# Patient Record
Sex: Male | Born: 1945 | Race: White | Hispanic: No | Marital: Married | Smoking: Former smoker
Health system: Southern US, Community
[De-identification: ages and names within clinical notes are randomized; demographics above are authoritative.]

## PROBLEM LIST (undated history)

## (undated) DIAGNOSIS — E119 Type 2 diabetes mellitus without complications: Secondary | ICD-10-CM

## (undated) DIAGNOSIS — G5790 Unspecified mononeuropathy of unspecified lower limb: Secondary | ICD-10-CM

## (undated) DIAGNOSIS — J45909 Unspecified asthma, uncomplicated: Secondary | ICD-10-CM

## (undated) DIAGNOSIS — I10 Essential (primary) hypertension: Secondary | ICD-10-CM

## (undated) DIAGNOSIS — I739 Peripheral vascular disease, unspecified: Secondary | ICD-10-CM

## (undated) DIAGNOSIS — M1990 Unspecified osteoarthritis, unspecified site: Secondary | ICD-10-CM

## (undated) DIAGNOSIS — G4733 Obstructive sleep apnea (adult) (pediatric): Secondary | ICD-10-CM

## (undated) HISTORY — PX: HERNIA REPAIR: SHX51

## (undated) HISTORY — PX: CARPAL TUNNEL RELEASE: SHX101

## (undated) HISTORY — DX: Obstructive sleep apnea (adult) (pediatric): G47.33

## (undated) HISTORY — DX: Unspecified asthma, uncomplicated: J45.909

## (undated) HISTORY — PX: TONSILLECTOMY: SUR1361

## (undated) HISTORY — DX: Unspecified osteoarthritis, unspecified site: M19.90

## (undated) HISTORY — PX: KNEE ARTHROSCOPY: SUR90

## (undated) HISTORY — DX: Type 2 diabetes mellitus without complications: E11.9

## (undated) HISTORY — PX: CIRCUMCISION: SUR203

## (undated) HISTORY — PX: SPINE SURGERY: SHX786

## (undated) HISTORY — PX: JOINT REPLACEMENT: SHX530

## (undated) HISTORY — DX: Morbid (severe) obesity due to excess calories: E66.01

## (undated) HISTORY — DX: Peripheral vascular disease, unspecified: I73.9

## (undated) HISTORY — DX: Unspecified mononeuropathy of unspecified lower limb: G57.90

## (undated) HISTORY — DX: Essential (primary) hypertension: I10

## (undated) HISTORY — PX: GASTRIC BYPASS: SHX52

## (undated) HISTORY — PX: CHOLECYSTECTOMY: SHX55

---

## 1998-07-22 ENCOUNTER — Ambulatory Visit (HOSPITAL_COMMUNITY): Admission: RE | Admit: 1998-07-22 | Discharge: 1998-07-22 | Payer: Self-pay | Admitting: Orthopedic Surgery

## 1999-01-01 ENCOUNTER — Encounter: Payer: Self-pay | Admitting: Orthopedic Surgery

## 1999-01-01 ENCOUNTER — Ambulatory Visit (HOSPITAL_COMMUNITY): Admission: RE | Admit: 1999-01-01 | Discharge: 1999-01-01 | Payer: Self-pay | Admitting: Orthopedic Surgery

## 2004-12-02 ENCOUNTER — Encounter: Admission: RE | Admit: 2004-12-02 | Discharge: 2005-03-02 | Payer: Self-pay | Admitting: Surgery

## 2005-01-07 ENCOUNTER — Ambulatory Visit (HOSPITAL_COMMUNITY): Admission: RE | Admit: 2005-01-07 | Discharge: 2005-01-07 | Payer: Self-pay | Admitting: Surgery

## 2005-02-17 ENCOUNTER — Inpatient Hospital Stay (HOSPITAL_COMMUNITY): Admission: RE | Admit: 2005-02-17 | Discharge: 2005-02-20 | Payer: Self-pay | Admitting: Surgery

## 2005-04-17 ENCOUNTER — Encounter: Admission: RE | Admit: 2005-04-17 | Discharge: 2005-07-16 | Payer: Self-pay | Admitting: Surgery

## 2005-10-05 ENCOUNTER — Encounter: Admission: RE | Admit: 2005-10-05 | Discharge: 2005-12-13 | Payer: Self-pay | Admitting: Surgery

## 2006-01-08 ENCOUNTER — Encounter: Admission: RE | Admit: 2006-01-08 | Discharge: 2006-04-08 | Payer: Self-pay | Admitting: Surgery

## 2006-12-26 ENCOUNTER — Encounter: Admission: RE | Admit: 2006-12-26 | Discharge: 2006-12-26 | Payer: Self-pay | Admitting: Surgery

## 2009-01-18 ENCOUNTER — Inpatient Hospital Stay (HOSPITAL_COMMUNITY): Admission: RE | Admit: 2009-01-18 | Discharge: 2009-01-27 | Payer: Self-pay | Admitting: Neurosurgery

## 2009-06-25 ENCOUNTER — Encounter: Admission: RE | Admit: 2009-06-25 | Discharge: 2009-06-25 | Payer: Self-pay | Admitting: Neurosurgery

## 2010-02-18 ENCOUNTER — Encounter: Admission: RE | Admit: 2010-02-18 | Discharge: 2010-02-18 | Payer: Self-pay | Admitting: Neurosurgery

## 2010-03-21 ENCOUNTER — Inpatient Hospital Stay (HOSPITAL_COMMUNITY): Admission: RE | Admit: 2010-03-21 | Discharge: 2010-03-26 | Payer: Self-pay | Admitting: Orthopedic Surgery

## 2010-07-14 ENCOUNTER — Inpatient Hospital Stay (HOSPITAL_COMMUNITY): Admission: RE | Admit: 2010-07-14 | Discharge: 2010-07-17 | Payer: Self-pay | Admitting: Orthopedic Surgery

## 2010-07-16 ENCOUNTER — Ambulatory Visit: Payer: Self-pay | Admitting: Surgery

## 2010-07-16 ENCOUNTER — Encounter (INDEPENDENT_AMBULATORY_CARE_PROVIDER_SITE_OTHER): Payer: Self-pay | Admitting: Orthopedic Surgery

## 2011-01-03 ENCOUNTER — Encounter: Payer: Self-pay | Admitting: Surgery

## 2011-01-04 ENCOUNTER — Encounter: Payer: Self-pay | Admitting: Neurosurgery

## 2011-02-27 LAB — GLUCOSE, CAPILLARY
Glucose-Capillary: 109 mg/dL — ABNORMAL HIGH (ref 70–99)
Glucose-Capillary: 125 mg/dL — ABNORMAL HIGH (ref 70–99)
Glucose-Capillary: 133 mg/dL — ABNORMAL HIGH (ref 70–99)
Glucose-Capillary: 155 mg/dL — ABNORMAL HIGH (ref 70–99)
Glucose-Capillary: 155 mg/dL — ABNORMAL HIGH (ref 70–99)
Glucose-Capillary: 164 mg/dL — ABNORMAL HIGH (ref 70–99)

## 2011-02-27 LAB — BASIC METABOLIC PANEL
CO2: 29 mEq/L (ref 19–32)
Calcium: 8.4 mg/dL (ref 8.4–10.5)
GFR calc Af Amer: >60 mL/min (ref >60)
GFR calc non Af Amer: >60 mL/min (ref >60)
GFR calc non Af Amer: >60 mL/min (ref >60)
Glucose, Bld: 152 mg/dL — ABNORMAL HIGH (ref 70–99)
Glucose, Bld: 155 mg/dL — ABNORMAL HIGH (ref 70–99)
Potassium: 4 mEq/L (ref 3.5–5.1)
Potassium: 4.3 mEq/L (ref 3.5–5.1)
Sodium: 138 mEq/L (ref 135–145)
Sodium: 140 mEq/L (ref 135–145)

## 2011-02-27 LAB — CBC
HCT: 33.5 % — ABNORMAL LOW (ref 39.0–52.0)
Hemoglobin: 11.5 g/dL — ABNORMAL LOW (ref 13.0–17.0)
MCH: 30.5 pg (ref 26.0–34.0)
MCV: 87.9 fL (ref 78.0–100.0)
RBC: 3.77 MIL/uL — ABNORMAL LOW (ref 4.22–5.81)
RDW: 14.3 % (ref 11.5–15.5)
WBC: 6.7 10*3/uL (ref 4.0–10.5)

## 2011-02-27 LAB — PROTIME-INR
INR: 2.03 — ABNORMAL HIGH (ref 0.00–1.49)
Prothrombin Time: 23.1 seconds — ABNORMAL HIGH (ref 11.6–15.2)
Prothrombin Time: 26 seconds — ABNORMAL HIGH (ref 11.6–15.2)

## 2011-02-27 LAB — TYPE AND SCREEN: ABO/RH(D): O POS

## 2011-02-28 LAB — COMPREHENSIVE METABOLIC PANEL
Albumin: 4.6 g/dL (ref 3.5–5.2)
BUN: 13 mg/dL (ref 6–23)
Calcium: 9.9 mg/dL (ref 8.4–10.5)
Creatinine, Ser: 0.88 mg/dL (ref 0.4–1.5)
Total Protein: 7.8 g/dL (ref 6.0–8.3)

## 2011-02-28 LAB — CBC
MCH: 30.2 pg (ref 26.0–34.0)
MCV: 88.9 fL (ref 78.0–100.0)
Platelets: 285 10*3/uL (ref 150–400)
RDW: 14.6 % (ref 11.5–15.5)

## 2011-02-28 LAB — URINALYSIS, ROUTINE W REFLEX MICROSCOPIC
Hgb urine dipstick: NEGATIVE
Protein, ur: NEGATIVE mg/dL
Urobilinogen, UA: 1 mg/dL (ref 0.0–1.0)

## 2011-02-28 LAB — SURGICAL PCR SCREEN
MRSA, PCR: NEGATIVE
Staphylococcus aureus: NEGATIVE

## 2011-02-28 LAB — PROTIME-INR: INR: 1.09 (ref 0.00–1.49)

## 2011-02-28 LAB — APTT: aPTT: 31 seconds (ref 24–37)

## 2011-03-04 LAB — BASIC METABOLIC PANEL
BUN: 6 mg/dL (ref 6–23)
BUN: 7 mg/dL (ref 6–23)
CO2: 28 mEq/L (ref 19–32)
CO2: 30 mEq/L (ref 19–32)
Calcium: 8.7 mg/dL (ref 8.4–10.5)
Chloride: 103 mEq/L (ref 96–112)
Chloride: 103 mEq/L (ref 96–112)
Creatinine, Ser: 0.77 mg/dL (ref 0.4–1.5)
GFR calc Af Amer: >60 mL/min (ref >60)
GFR calc Af Amer: >60 mL/min (ref >60)
GFR calc non Af Amer: >60 mL/min (ref >60)
Glucose, Bld: 136 mg/dL — ABNORMAL HIGH (ref 70–99)
Glucose, Bld: 142 mg/dL — ABNORMAL HIGH (ref 70–99)
Potassium: 3.7 mEq/L (ref 3.5–5.1)
Potassium: 4.6 mEq/L (ref 3.5–5.1)
Sodium: 139 mEq/L (ref 135–145)

## 2011-03-04 LAB — COMPREHENSIVE METABOLIC PANEL
ALT: 52 U/L (ref 0–53)
AST: 48 U/L — ABNORMAL HIGH (ref 0–37)
Albumin: 4.6 g/dL (ref 3.5–5.2)
CO2: 31 mEq/L (ref 19–32)
Calcium: 9.8 mg/dL (ref 8.4–10.5)
GFR calc Af Amer: >60 mL/min (ref >60)
GFR calc non Af Amer: >60 mL/min (ref >60)
Sodium: 140 mEq/L (ref 135–145)

## 2011-03-04 LAB — PROTIME-INR
INR: 1.02 (ref 0.00–1.49)
INR: 2.23 — ABNORMAL HIGH (ref 0.00–1.49)
INR: 2.23 — ABNORMAL HIGH (ref 0.00–1.49)
INR: 2.3 — ABNORMAL HIGH (ref 0.00–1.49)
Prothrombin Time: 13.3 seconds (ref 11.6–15.2)
Prothrombin Time: 17.5 seconds — ABNORMAL HIGH (ref 11.6–15.2)
Prothrombin Time: 24.5 seconds — ABNORMAL HIGH (ref 11.6–15.2)
Prothrombin Time: 25.1 seconds — ABNORMAL HIGH (ref 11.6–15.2)

## 2011-03-04 LAB — CBC
HCT: 32.8 % — ABNORMAL LOW (ref 39.0–52.0)
HCT: 35.7 % — ABNORMAL LOW (ref 39.0–52.0)
Hemoglobin: 11.4 g/dL — ABNORMAL LOW (ref 13.0–17.0)
MCHC: 34.2 g/dL (ref 30.0–36.0)
MCHC: 34.2 g/dL (ref 30.0–36.0)
MCHC: 34.6 g/dL (ref 30.0–36.0)
MCHC: 34.8 g/dL (ref 30.0–36.0)
MCV: 91.4 fL (ref 78.0–100.0)
MCV: 92.3 fL (ref 78.0–100.0)
MCV: 92.9 fL (ref 78.0–100.0)
MCV: 93.1 fL (ref 78.0–100.0)
Platelets: 221 10*3/uL (ref 150–400)
Platelets: 232 10*3/uL (ref 150–400)
RBC: 3.59 MIL/uL — ABNORMAL LOW (ref 4.22–5.81)
RBC: 3.83 MIL/uL — ABNORMAL LOW (ref 4.22–5.81)
RBC: 4.76 MIL/uL (ref 4.22–5.81)
RDW: 13.5 % (ref 11.5–15.5)
WBC: 6.2 10*3/uL (ref 4.0–10.5)
WBC: 8 10*3/uL (ref 4.0–10.5)
WBC: 8.6 10*3/uL (ref 4.0–10.5)

## 2011-03-04 LAB — TYPE AND SCREEN
ABO/RH(D): O POS
Antibody Screen: NEGATIVE

## 2011-03-04 LAB — URINALYSIS, ROUTINE W REFLEX MICROSCOPIC
Bilirubin Urine: NEGATIVE
Nitrite: NEGATIVE
Specific Gravity, Urine: 1.023 (ref 1.005–1.030)
Urobilinogen, UA: 1 mg/dL (ref 0.0–1.0)

## 2011-03-04 LAB — GLUCOSE, CAPILLARY
Glucose-Capillary: 106 mg/dL — ABNORMAL HIGH (ref 70–99)
Glucose-Capillary: 122 mg/dL — ABNORMAL HIGH (ref 70–99)
Glucose-Capillary: 133 mg/dL — ABNORMAL HIGH (ref 70–99)
Glucose-Capillary: 140 mg/dL — ABNORMAL HIGH (ref 70–99)
Glucose-Capillary: 145 mg/dL — ABNORMAL HIGH (ref 70–99)
Glucose-Capillary: 150 mg/dL — ABNORMAL HIGH (ref 70–99)
Glucose-Capillary: 162 mg/dL — ABNORMAL HIGH (ref 70–99)

## 2011-03-04 LAB — ABO/RH: ABO/RH(D): O POS

## 2011-03-31 LAB — GLUCOSE, CAPILLARY
Glucose-Capillary: 109 mg/dL — ABNORMAL HIGH (ref 70–99)
Glucose-Capillary: 113 mg/dL — ABNORMAL HIGH (ref 70–99)
Glucose-Capillary: 117 mg/dL — ABNORMAL HIGH (ref 70–99)
Glucose-Capillary: 124 mg/dL — ABNORMAL HIGH (ref 70–99)
Glucose-Capillary: 126 mg/dL — ABNORMAL HIGH (ref 70–99)
Glucose-Capillary: 132 mg/dL — ABNORMAL HIGH (ref 70–99)
Glucose-Capillary: 138 mg/dL — ABNORMAL HIGH (ref 70–99)
Glucose-Capillary: 82 mg/dL (ref 70–99)
Glucose-Capillary: 83 mg/dL (ref 70–99)
Glucose-Capillary: 88 mg/dL (ref 70–99)
Glucose-Capillary: 91 mg/dL (ref 70–99)
Glucose-Capillary: 98 mg/dL (ref 70–99)

## 2011-03-31 LAB — CBC
Hemoglobin: 13.2 g/dL (ref 13.0–17.0)
MCHC: 34.2 g/dL (ref 30.0–36.0)
MCV: 89.8 fL (ref 78.0–100.0)
RBC: 4.3 MIL/uL (ref 4.22–5.81)

## 2011-03-31 LAB — BASIC METABOLIC PANEL
CO2: 28 mEq/L (ref 19–32)
Chloride: 107 mEq/L (ref 96–112)
GFR calc Af Amer: >60 mL/min (ref >60)
Potassium: 4.1 mEq/L (ref 3.5–5.1)
Sodium: 140 mEq/L (ref 135–145)

## 2011-04-28 NOTE — Op Note (Signed)
NAME:  Jon Gonzalez, FELMLEE NO.:  [id number]   MEDICAL RECORD NO.:  [id number]          PATIENT TYPE:  INP   LOCATION:  3013                         FACILITY:  [facility]   PHYSICIAN:  Donalee Citrin, M.D.        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   PROCEDURE:  Decompressive laminectomies at L3-4 and L4-5 in excess what  would be needed with standard interbody fusion, posterior lumbar  interbody fusion at L3-4 and L4-5 using 12 x 22 mm Telamon PEEK cage on  one side with tangent allograft wedge on the other side with local  autograft mixed with Actifuse in the middle and packing the cage with  that, pedicle screw fixation at L3-L5 using a 6.35 Legacy Pedicle Screw  System, posterolateral arthrodesis at L3-L5 using local autograft packed  with Actifuse and placement of a large Hemovac drain.   SURGEON:  Donalee Citrin, MD   ASSISTANT:  Kathaleen Maser. Pool, MD   ANESTHESIA:  General endotracheal.   HISTORY OF PRESENT ILLNESS:  The patient is a 65 year old gentleman who  has had progressive worsening back and predominantly bilateral leg pain  worse on the left in L5 distribution and worse on the right in L3  distribution consistent with a large synovial cyst on the left at L4-5  and on the right disk herniation at L3-4.  The patient has failed all  forms of conservative treatment, had progressive more difficulty with  leg pain, so the patient was recommended decompression and stabilization  procedure.  The risks and benefits of the operation were explained to  the patient.  He understood and agreed to proceed forward.   DESCRIPTION OF PROCEDURE:  The patient was brought to the OR, induced  under  general anesthesia.  He was placed prone on the Wilson frame and  her back was prepped and draped in the usual sterile fashion  Preop  incision was localized at the appropriate level and after infiltration  of 10 mL of lidocaine with epinephrine, a midline incision was made.  Bovie electrocautery was used to take down the subperiosteum and  dissection was carried down the lamina of L3, L4 and L5 bilaterally  exposing the T-piece at each side.  Then spinous processes at L3 and L4  were removed.  The facet joints at L3-4 and L4-5 were drilled down, then  complete central decompression was performed at L3-4 and L4-5 with  complete medial facetectomies initially at L3-4, decompressive both at  L3 and L4 roots and  exposing the lateral margin of the disk space.  Then attention was taken at L4-5.  Central decompression and complete  medial facetectomies were performed.  A very large synovial cyst densely  adherent to the dura was immediately identified on the left at L4-5.  This was teased away with a #4 Penfield unroofing both the L4 and L5  neural foramen getting access to the lateral margin and disk space was  achieved there as well.  After complete decompression have been  performed, attention was taken to pedicle screw placement just using a  high-speed drill.  A high-speed drill was used to drill pilot holes at  L3, cannulated with the awl, probed, tapped with a 5.5 tap, probed again  and 6.5 x 45 screws were inserted at left at L3.  Fluoroscopy was used  at each step along the way as well as external and internal bony  landmarks to confirm no medial and lateral breach and at the L4-L5  screws on the left were inserted in a similar fashion as well as L3, L4  and L5 screws on the right.  After all 6 screws were in place, they were  all 6.5 x 45, attention then taken to the interbody work.  The left L4  nerve root was felt immediately.  Disk space was incised and cleaned out  radically.   Several large fragments of disk were removed from the  central compartment.  A size 12 distractor was inserted.  This had good  apposition and the endplates were felt to be of appropriate sizing for  the graft material so then working on the right side, disk was cleaned  out and several large fragments were removed from the lateral  compartment underneath the tree root and after disk space was cleaned  out it was cut with a size 12 cutter and chisel.  Endplates were  prepped.  Central disk was scraped with an Epstein and a 12 x 20 mm  Telamon PEEK cage packed with local autograft mixed with Actifuse was  inserted on the right at L3-4.  Then distractor was removed.  Fluoroscopy confirmed each step along the way.  Good position of the  interbody spacer.  Then working on the left in a similar fashion disk  space was cleaned out.  Endplates were prepped, local autograft was  packed centrally and a left-sided tangent was inserted.  Then working at  L4-5 in a similar fashion disk space was cleaned out, endplates were  prepped and 12 x 22 mm Telamon PEEK cage and tangent allograft wedge  were inserted on opposite sides from L3-4 respectively with local  autograft centrally.  Then after all interbody work, the wound was  copious irrigated and meticulous hemostasis was maintained.  Aggressive  decortication was carried at T-piece and lateral gutters.  The remainder  of the local autograft was packed posterolaterally from L3-L5.  Then 70-  mm rods were placed __________ down at L5.  The L4 screw was compressed  against L5 and L3 was compressed against L4.  Then a 420 cross-link was  inserted.  All neural foramina were reinspected and noted to be widely  patent.  Meticulous hemostasis was maintained.  Gelfoam was overlaid on  top of the dura.  A large Hemovac drain was placed.  The wound was then  closed in layers with interrupted Vicryl and the skin was closed with  running 4-0 subcuticular.   Benzoin and Steri-Strips were applied.  The  patient went to recovery room in stable condition.           ______________________________  Donalee Citrin, M.D.     GC/MEDQ  D:  01/18/2009  T:  01/19/2009  Job:  [id number]

## 2011-04-28 NOTE — Consult Note (Signed)
NAME:  ALEJOS, REINHARDT NO.:  [id number]   MEDICAL RECORD NO.:  [id number]          PATIENT TYPE:  INP   LOCATION:  3013                         FACILITY:  [facility]   PHYSICIAN:  Antonietta Breach, M.D.  DATE OF BIRTH:  1946-01-03   DATE OF CONSULTATION:  01/24/2009  DATE OF DISCHARGE:  01/27/2009                                 CONSULTATION   REASON FOR CONSULTATION:  Psychosis, agitation, anxiety, trances.  Please evaluate and recommend treatment.   REQUESTING PHYSICIAN:  Donalee Citrin, M.D.   HISTORY OF PRESENT ILLNESS:  Mr. Levario is a 65 year old married male  admitted to the [facility] on January 02, 2009 due to a lumbar  herniated nucleus pulposus.   Mr. Crilly required a fusion of the vertebral area on February 5.   Postoperatively he was confused and violent after the morphine was  reduced.  After the morphine was increased back to the postoperative  amount, his agitation and violence resolved.   However, he has had intermittent episodes of becoming nonresponsive  while remaining alert with his eyes closed.  This state his confirmed  because he has eye-opening resistance.   When the undersigned questioned the patient about these experiences, he  acknowledged that it was a way for him to close himself off from any  additional stimuli while he is having to deal with so much internally,  including his physical pain.   He does have a prior history of utilizing these trance state in order to  deal with stress.  During these states, his leg can be lifted up off the  bed and when dropped it will hold in place.   He has had some paranoia this past week.  However, it is now resolved.   He has constructive future goals and interests.  He does not have any  thoughts of harming himself or others.  He has no current hallucinations  or delusions.  His memory and orientation function are intact.   His wife is consistently visiting him in the room and is supportive.  The patient asks that she remain for the session to help facilitate  support and education.   PAST PSYCHIATRIC HISTORY:  Mr. Grassia does not have a history of increased  energy or decreased need for sleep.  He has never made any suicide  attempts.  He has not been admitted to a psychiatric hospital.  He  denies any problems with alcohol or illegal drugs.   Mr. Encinas does have a history of excessive worry, feeling on edge, and  muscle tension for years.  He has been maintained on Xanax 1 mg up to 4  times per day to control the anxiety.  He also does have a history of  depression which is being treated with Cymbalta 30 mg b.i.d.   FAMILY PSYCHIATRIC HISTORY:  None known.   SOCIAL HISTORY:  Mutually supportive marriage.  Occupation, medically  retired.  Please see the above discussion.   PAST MEDICAL HISTORY:  Please see the above.  Obesity, peptic ulcer  disease, non-insulin requiring diabetes mellitus.   MEDICATIONS:  [facility] is reviewed.  1. Xanax 1 mg q.i.d.  2. Cymbalta 30 mg b.i.d.   ALLERGIES:  No known drug allergies.   LABORATORY DATA:  Sodium 140, BUN 9, creatinine 0.77, glucose 174, WBC  7.8, hemoglobin 13.2, platelet count 193,000.   Head CT without contrast on February 6 showed normal noncontrast head  CT.   EKG, QTC 430 ms.   REVIEW OF SYSTEMS:  Constitutional, head, eyes, ears, nose, throat,  mouth, neurologic, cardiovascular, respiratory, gastrointestinal,  genitourinary, skin, musculoskeletal, hematologic, lymphatic, endocrine,  and metabolic are all unremarkable.   EXAMINATION:  VITAL SIGNS:  Temperature 98.6, pulse 100, respiratory  rate 18, blood pressure 153/75, O2 saturation on 2 liters 98%.  GENERAL APPEARANCE:  Mr. Creque is a middle-aged male lying in a supine  position in his hospital bed with no abnormal involuntary movements.   MENTAL STATUS EXAM:  Mr. Siefring has intact eye contact.  His attention  span is slightly decreased.  Concentration slightly  decreased.  Affect  is mildly anxious at baseline but with a broad and appropriate range.  His mood is slightly anxious.  He is oriented to all spheres.  His  memory is intact to immediate recent and remote.  His fund of knowledge  and intelligence are within normal limits.  His speech involves normal  rate and prosody.  71 without dysarthria.  Thought process is logical,  coherent, goal-directed.  No looseness of associations.  Thought  content, no thoughts of harming himself or others no delusions or  hallucinations.  His insight is intact judgment is intact.   ASSESSMENT:  AXIS I:  1. 293.00 Delirium not otherwise specified, now clear.  2. 293.84 Anxiety disorder not otherwise specified, rule out      generalized anxiety disorder.  3. Rule out dissociative disorder not otherwise specified (trance      state).  AXIS II:  Deferred.  AXIS III:  See past medical history.  AXIS IV:  General medical.  AXIS V:  55.   Mr. Turpen is not at risk to harm himself or others.  He agrees to call  emergency services immediately for any thoughts of harming himself or  others or distress.   The undersigned provided ego supportive psychotherapy and education.   The indications, alternatives, and adverse effects of his psychotropic  medication were reviewed including Xanax and Cymbalta.  He understands  and would like to proceed as below.   Would decrease the Xanax slowly to a dosage they can still maintain anti-  acute anxiety but will also allow for a lower risk for delirium.  This  can be done by decreasing the daily total dosage by 1/4 every 1 to 2  days.   For any re-exacerbation of agitation, would utilize Ativan 0.5 to 2 mg  p.o. IM or IV q.4 hours p.r.n.   No change in the Cymbalta for anti-depression.  Would monitor for any  nausea or loose stools as side effects.   Would ask the social worker to set up an outpatient psychotherapy  appointment as well as a psychiatric medication  management appointment.   Mr. Lex would be an excellent candidate for cognitive behavioral  therapy combined with deep breathing and progressive muscle relaxation  training.  This type of therapy can synergize with his medication in  helping with anxiety as well as depression.   Mr. Payne agrees to not drive if drowsy.      Antonietta Breach, M.D.  Electronically Signed  JW/MEDQ  D:  01/28/2009  T:  01/28/2009  Job:  [id number]

## 2011-04-28 NOTE — Consult Note (Signed)
NAME:  ZYMIR, NAPOLI NO.:  [id number]   MEDICAL RECORD NO.:  [id number]          PATIENT TYPE:  INP   LOCATION:  3013                         FACILITY:  [facility]   PHYSICIAN:  Noel Christmas, MD    DATE OF BIRTH:  03/09/1946   DATE OF CONSULTATION:  01/19/2009  DATE OF DISCHARGE:                                 CONSULTATION   REFERRING PHYSICIAN:  Donalee Citrin, MD.   REASON FOR CONSULTATION:  Altered mental status postoperatively.   HISTORY:  This is a 65 year old man who underwent laminectomy and  decompression of lumbar spine on January 18, 2009, for spinal stenosis  affecting levels L3-4 and L4-5.  Procedure was unremarkable without  complications.  The patient was given general anesthesia.  He has had  signs of altered mental status postoperatively consisting of confusion  with agitation as well as hallucinations and combativeness.  The patient  has required treatment with Haldol as well as physical restraint of his  limbs.  He was on Dilaudid for postoperative pain.  He has a history of  being treated with morphine sulfate 30 mg 3 times a day.  He also has  chronic anxiety and he has been on Xanax 1 mg 4 times a day.  These  medications have been restarted.  He showed mild signs of possible  opiate withdrawal prior to morphine being resumed, with tremulousness  and sweating.  Signs of withdrawal have ceased since morphine was  restarted.  He has remained confused, but less combative.  CT scan of  his head has been ordered and is pending.  Family gives the history of  similar type of confusion in 2006 postoperatively following gastric  bypass surgery.  They describe similar type of confusion with agitation  as well as hallucinations.   PAST MEDICAL HISTORY:  Remarkable for obesity, depression and anxiety,  peptic ulcer disease, and non-insulin-requiring diabetes mellitus.   ROUTINE MEDICATIONS:  1. Morphine sulfate 30 mg 3 times a day.  2. Cymbalta 30 mg  twice a day.  3. Multivitamin daily.  4. Iron tablet daily.  5. Vitamin B12 daily.  6. Piroxicam 20 mg per day.  7. Xanax 1 mg 4 times a day.  8. Lyrica 75 mg twice a day.  9. Metformin 500 mg twice a day.  10.Asmanex.  11.Fluticasone suspension nasal spray.  12.HyoMax 0.125 mg p.r.n.  13.Sucraid solution 30 minutes before each meal and at bedtime.   CURRENT MEDICATIONS:  1. Carafate 1 g nightly.  2. Cymbalta 30 mg twice a day.  3. Multivitamin daily.  4. Iron sulfate 25 mg daily.  5. Vitamin B12 250 mcg per day.  6. Piroxicam 20 mg per day.  7. Morphine sulfate 30 mg 3 times day.  8. Xanax 1 mg q.i.d.  9. Lyrica 75 mg b.i.d.  10.Metformin 500 mg b.i.d.  11.Haldol 5-3 mg q.4 h. p.r.n.  12.Flexeril 10 mg p.r.n.  13.Cefazolin 1 g q.8 h. IV.  14.Ambien 10 mg nightly.   FAMILY HISTORY:  Positive for ischemic heart disease before 65 years  old, hypertension, diabetes mellitus, stroke,  and cancer.   PHYSICAL EXAMINATION:  GENERAL:  Appearance was that of markedly obese  middle-aged man who was alert and only moderately cooperative.  He was  in no acute distress.  He was oriented to place as well as time.  He  tended to confabulate rather freely.  There was no indication of active  hallucinations.  HEENT:  Pupils could not be adequately assessed because the patient  refused.  His extraocular movements were full and conjugate.  Visual  fields were intact and normal.  There was no facial weakness.  Hearing  was normal.  Speech and palatal movement were normal.  EXTREMITIES:  Strength and muscle tone were normal throughout.  Deep  tendon reflexes were normal and symmetrical except for absent of right  ankle reflex.  Plantar responses were flexor.  Sensory examination was  normal.  Carotid auscultation was normal.   CLINICAL IMPRESSION:  1. Acute mental status changes most likely secondary to combination of      factors including withdrawal from morphine and probably from       benzodiazepines as well, as well as possible prolonged reaction to      anesthetic agent.  2. No focal abnormalities on examination with indicating focal central      nervous system deficit.   RECOMMENDATIONS:  1. CT scan of the head is planned.  2. EEG on January 21, 2009, if the patient has not significantly      improved by then.  3. Continue current treatment with current doses of morphine as well      as Xanax.  4. Continue Haldol as well as physical restraints as needed.   Thank you for asking me to evaluate Mr. Jarosz.      Noel Christmas, MD  Electronically Signed     CS/MEDQ  D:  01/19/2009  T:  01/20/2009  Job:  [id number]

## 2011-05-01 NOTE — Discharge Summary (Signed)
NAME:  Jon Gonzalez, Jon Gonzalez NO.:  [id number]   MEDICAL RECORD NO.:  [id number]          PATIENT TYPE:  INP   LOCATION:  3013                         FACILITY:  [facility]   PHYSICIAN:  Donalee Citrin, M.D.        DATE OF BIRTH:  05/30/1946   DATE OF ADMISSION:  01/18/2009  DATE OF DISCHARGE:  01/27/2009                               DISCHARGE SUMMARY   ADMITTING DIAGNOSES:  1. Degenerative disk disease.  2. Lumbar spinal stenosis L3-4 and L4-5.   PROCEDURE DURING THIS HOSPITALIZATION:  Posterior lumbar interbody  fusion L3-4 and L4-5.   HOSPITAL COURSE:  The patient is a very pleasant 65 year old gentleman  who was admitted and underwent the aforementioned procedure.  Postop,  the patient did very well and recovering on the floor.  However on the  floor, the patient became very agitated that night and workup, which was  essentially unremarkable.  It was felt that his agitations and severe  hallucinations were due to opioid withdrawal.  The patient was  reinitiated back on the prehospital medications as well as some  anxiolytics and over the next 24-48 hour, the patient did get better,  did have episodes where he would become unresponsive; however, this was  not neurologic and felt to be more psychologic.  The patient was  actually seen by Dr. Jeanie Sewer of Psychology as well as seen by  Neurology for workup of encephalopathy and both workups essentially were  negative.  The psychologic workup did initiate some different medication  and the patient did improve over the next several days at the time of  discharge which was hospital day 9.  The patient was awake and alert.  He was voiding spontaneously.  He had no leg pain.  His pain was well  controlled on his oral medication.  He was able to be discharged home  with home physical and occupational therapy.           ______________________________  Donalee Citrin, M.D.     GC/MEDQ  D:  02/20/2009  T:  02/21/2009  Job:  [id number]

## 2011-05-01 NOTE — Op Note (Signed)
NAME:  BAZIL, DHANANI NO.:  [id number]   MEDICAL RECORD NO.:  [id number]          PATIENT TYPE:  INP   LOCATION:  0154                         FACILITY:  [facility]   PHYSICIAN:  Thornton Park. Daphine Deutscher, MD  DATE OF BIRTH:  [birth year]   DATE OF PROCEDURE:  02/17/2005  DATE OF DISCHARGE:                                 OPERATIVE REPORT   PREOPERATIVE DIAGNOSES:  Morbid obesity, BMI of 47.5 with multiple  comorbidities including diabetes, hypertension.   POSTOPERATIVE DIAGNOSES:  Morbid obesity, BMI of 47.5 with multiple  comorbidities including diabetes, hypertension.   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   SURGEON:  Thornton Park. Daphine Deutscher, MD   ASSISTANT:  Lorne Skeens. Hoxworth, MD   ANESTHESIA:  General endotracheal.   DRAINS:  None. (The patient antecolic, antegastric Roux-en-Y with limb  facing left.)   DESCRIPTION OF PROCEDURE:  Mr. Bradburn the 65 year old gentleman who was taken  to room 1 on February 17, 2005 and given general anesthesia. The abdomen was  prepped widely with Betadine and draped sterilely. I entered the abdomen in  the left upper quadrant using an OptiVu trocar and a zero degree scope  without difficulty. The abdomen was insufflated. He had a very protuberant  anterior abdomen and two 12's were placed on the right and one above the  umbilicus slightly to the left and another 5 laterally on the left and  ultimately the 5 mm in the upper midline for liver retraction were used. The  patient did not have any adhesions to his previous umbilical hernia repair.  I initially identified the ligament of Treitz and measured 40 cm downstream  were I divided it with the Ethicon 60 cm stapler using a white load and  divided the mesentery further with Harmonic scalpel. I put a Penrose drain  on the distal end and held the biliopancreatic limb and then counted out 100  cm sweeping the Roux limb into the  right lower quadrant and then aligning  these such that the Roux limb would go toward the patient's left with the  end toward the patient's left and then I tacked the antimesenteric borders  of the loops of bowel and then created a common enterotomy using the Ethicon  60 stapler. The common defect was closed with two sutures of 2-0 Vicryl and  with Tisseel. The mesenteric defect was closed with running 2-0 silk.   Next with the [facility] retractor in place,  I dissected the cardia and then  dissected along the lesser curvature and decided on a site that would give  me about a 4-5 cm long pouch and then began dividing the stomach  sequentially with the 60 cm Ethicon stapler.  The first load was a gold load  followed by blue loads to completely divide the stomach. I put some clips on  some bleeders through the staple line and Tisseel was placed along the  remnant as well as along the proximal portion of the pouch.   The Roux limb was then brought up and  sewn along the posterior staple line  with a running 2-0 Vicryl, common openings were made and again the Ethicon  blue load was used with the 40 mm stapler which was used to create the  anastomosis. This was inspected and no bleeding was seen. The common  openings were closed with running 2-0 Vicryl's and a second layer using a  free suture of 2-0 Vicryl was used to imbricate and close the anterior layer  to make a complete two-layer closure.   Dr. Johna Sheriff then performed endoscopy so we could pressure test the proximal  pouch and the anastomosis and look for evidence of bleeding. No bleeding was  seen and no bubbles were seen. The pouch was felt to be secure. Tisseel was  placed on the anterior row and then also on the tip of the Roux. No drain  was placed. The patient seemed to tolerate this procedure well. We surveyed  everything and everything looked good and the irrigant was withdrawn. There  was no evidence of bleeding and wounds were  all closed with 4-0 Vicryl with  Benzoin Steri-Strips. The patient was taken to recovery room and then  transferred to the intensive care unit for postoperative monitoring.      MBM/MEDQ  D:  02/18/2005  T:  02/18/2005  Job:  [id number]   cc:   Linward Foster, M.D.

## 2011-05-01 NOTE — Discharge Summary (Signed)
NAME:  NOAM, KARAFFA NO.:  [id number]   MEDICAL RECORD NO.:  [id number]          PATIENT TYPE:  INP   LOCATION:  0481                         FACILITY:  [facility]   PHYSICIAN:  Thornton Park. Daphine Deutscher, MD  DATE OF BIRTH:  10-Aug-1946   DATE OF ADMISSION:  02/17/2005  DATE OF DISCHARGE:  02/20/2005                                 DISCHARGE SUMMARY   ADMISSION DIAGNOSES:  Morbid obesity with multicomorbid conditions.   DISCHARGE DIAGNOSES:  Morbid obesity with multicomorbid conditions.   PROCEDURE:  Laparoscopic roux-en-Y gastric bypass on February 17, 2005.   HOSPITAL COURSE:  Mr. Pilz is a 65 year old white male with diabetes, COPD,  hypertension, bipolar disorder who presents for laparoscopic roux-Y gastric  bypass. This was undertaken on March 7 uneventfully.  Postoperatively he was  kept in the intensive care unit for two days. His swallow on day one looked  very good and drained promptly.  He was advanced on the laparoscopic gastric  bypass diet and was ready for discharge on the third postoperative day. The  patient seemed to be doing well. Condition good. He had no drain at the time  of discharge.  He will be followed up in the office.   IMPRESSION:  Status post laparoscopic roux-en-Y gastric bypass doing well.  Followup in the office.      MBM/MEDQ  D:  02/20/2005  T:  02/20/2005  Job:  [id number]

## 2011-09-28 IMAGING — CR DG LUMBAR SPINE 2-3V
3 series · 3 of 3 positions shown · non-contrast
Comparison: 06/25/2009.

CLINICAL DATA: Back pain.  Prior lumbar fusion.

LUMBAR SPINE - 2-3 VIEW

[t l-spine a.p.]
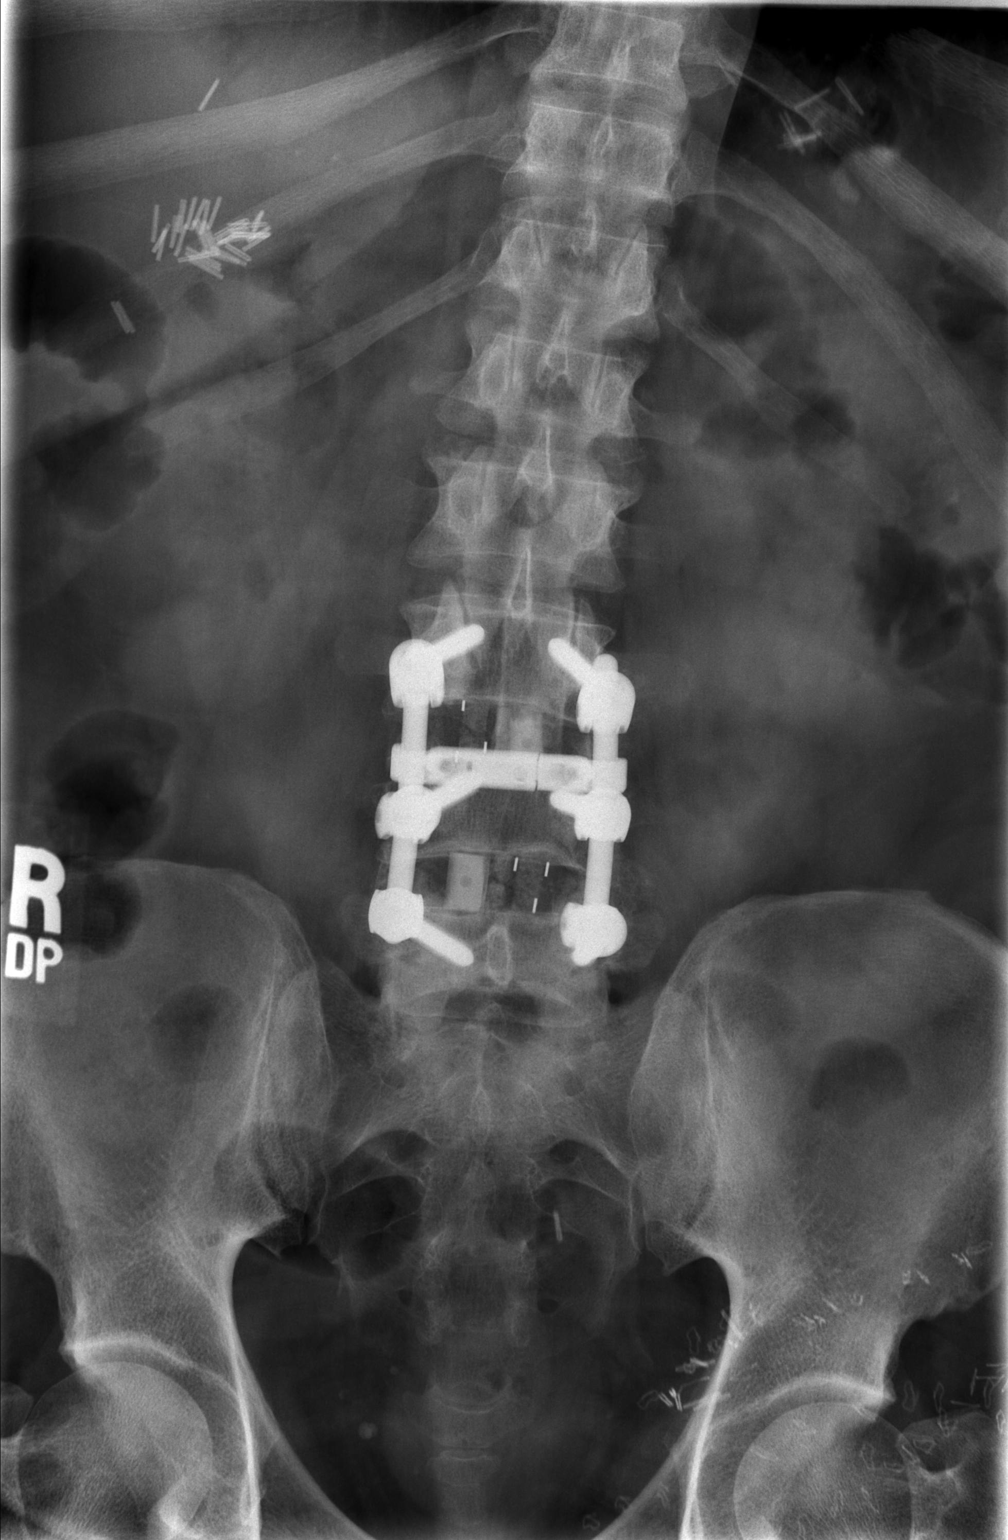

[t l-spine lat]
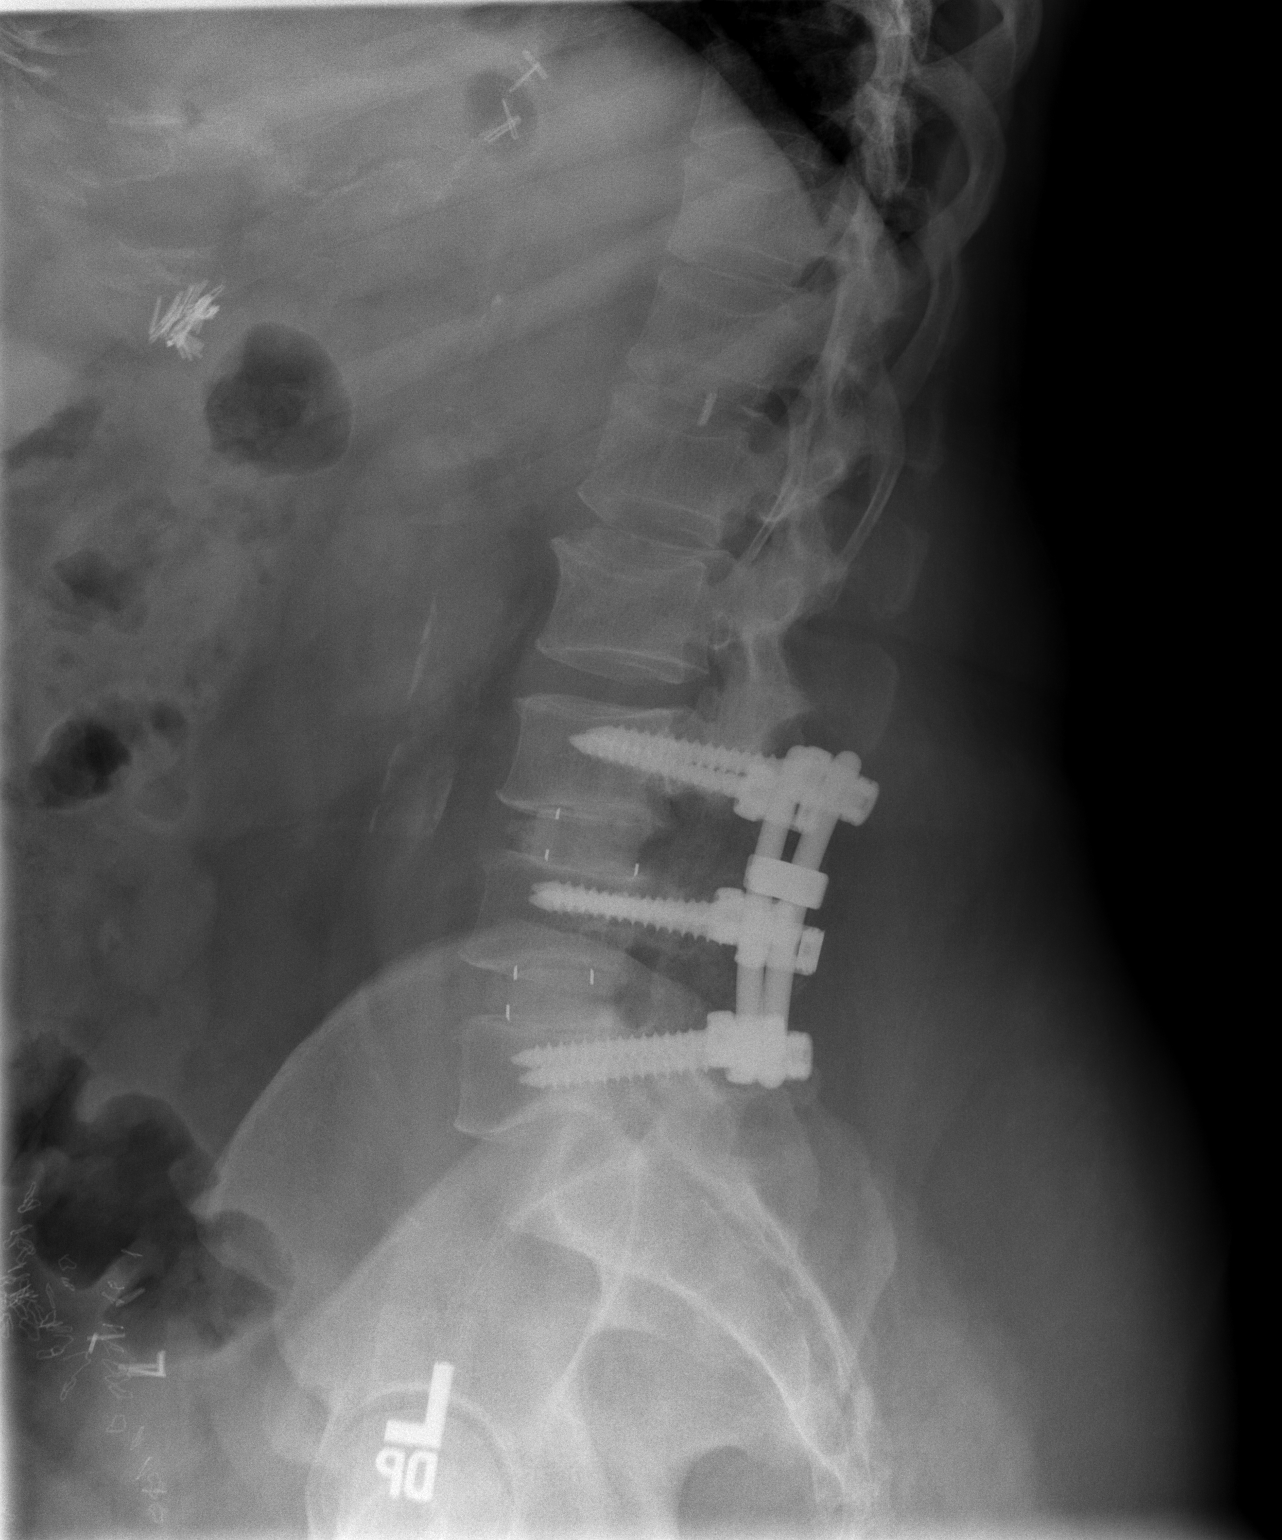

[t l-spine l5-s1 spot]
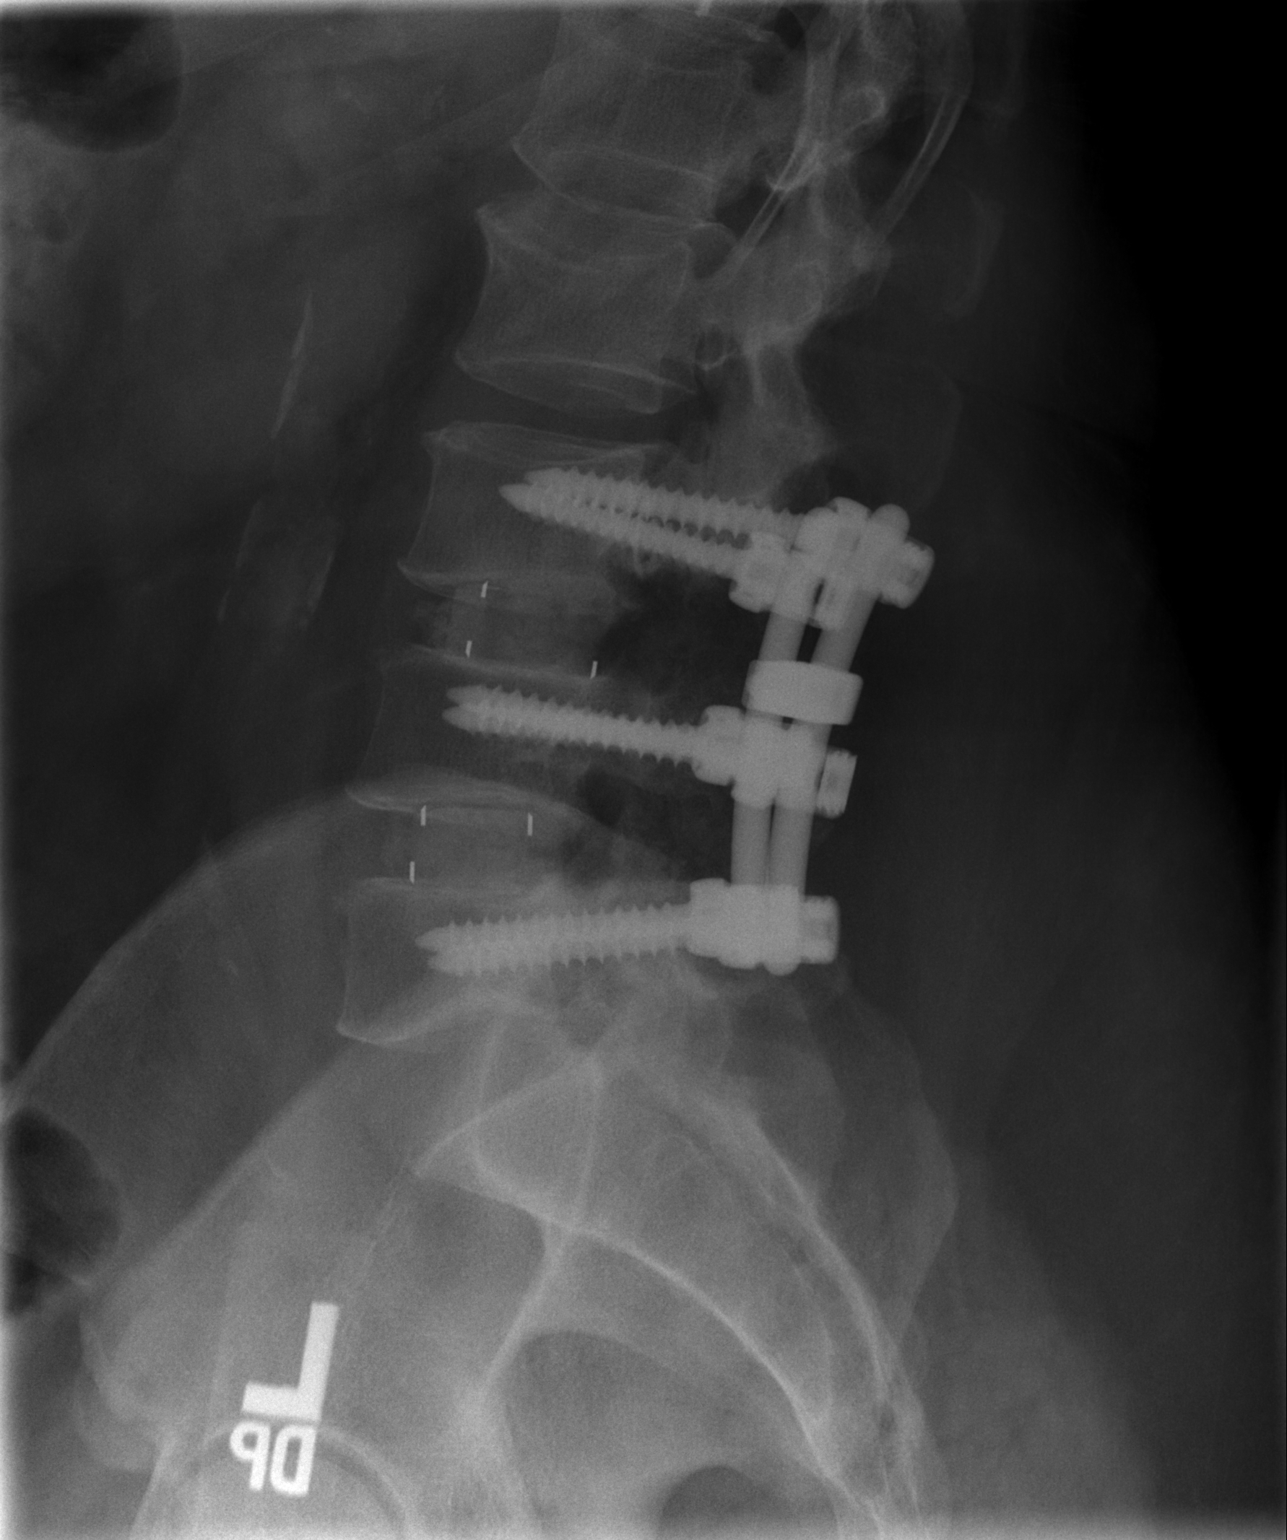

[3 of 3 positions shown; findings below may reference images not displayed]

FINDINGS: Stable appearance and position of the fusion hardware at
L3-4 and L4-5.  Incomplete interbody fusion.  The L1-2 and L5-S1
disc spaces are maintained.  The alignment is normal.  No acute
bony findings.  The visualized bony pelvis is intact.
IMPRESSION: 1.  Stable fusion hardware at L3-4 and L4-5.
2.  Incomplete interbody fusion.
3.  Normal alignment.

## 2014-12-24 ENCOUNTER — Other Ambulatory Visit: Payer: Self-pay | Admitting: *Deleted

## 2014-12-24 DIAGNOSIS — I739 Peripheral vascular disease, unspecified: Secondary | ICD-10-CM

## 2014-12-26 ENCOUNTER — Encounter: Payer: Self-pay | Admitting: Vascular Surgery

## 2014-12-26 ENCOUNTER — Encounter: Payer: Self-pay | Admitting: *Deleted

## 2015-01-21 ENCOUNTER — Encounter: Payer: Self-pay | Admitting: Vascular Surgery

## 2015-01-23 ENCOUNTER — Ambulatory Visit (INDEPENDENT_AMBULATORY_CARE_PROVIDER_SITE_OTHER): Payer: 59 | Admitting: Vascular Surgery

## 2015-01-23 ENCOUNTER — Encounter: Payer: Self-pay | Admitting: Vascular Surgery

## 2015-01-23 ENCOUNTER — Ambulatory Visit (HOSPITAL_COMMUNITY)
Admission: RE | Admit: 2015-01-23 | Discharge: 2015-01-23 | Disposition: A | Discharge status: Home / Self Care | Payer: 59 | Source: Ambulatory Visit | Attending: Vascular Surgery | Admitting: Vascular Surgery

## 2015-01-23 VITALS — BP 138/63 | HR 95 | Wt 224.0 lb

## 2015-01-23 DIAGNOSIS — I70219 Atherosclerosis of native arteries of extremities with intermittent claudication, unspecified extremity: Secondary | ICD-10-CM | POA: Diagnosis not present

## 2015-01-23 DIAGNOSIS — I739 Peripheral vascular disease, unspecified: Secondary | ICD-10-CM

## 2015-01-23 NOTE — Progress Notes (Signed)
Patient ID: Jon Gonzalez, male   DOB: 05/17/1946, 69 y.o.   MRN: [name]  Reason for Consult: New Evaluation   Referred by Bettey MareGimenez, David, MD  Subjective:     HPI:  Jon SciaraFrancis J Gonzalez is a 69 y.o. male who is referred with bilateral lower extremity claudication. He experiences pain in both calves which is brought on by ambulation and relieved somewhat with rest. He can walk up proximally 650 feet before experiencing symptoms. The symptoms have been going on for several months. I do not get any clear-cut history of rest pain. He does get cramps at night. He denies any history of nonhealing ulcers. He does have a history of neuropathy for many years. He's had previous back surgery and has had chronic low back pain. In addition he has diabetes. His blood sugar has been well controlled and his most recent hemoglobin A1c was 6.2.  Past Medical History  Diagnosis Date  . Peripheral arterial disease   . Neuropathy, lower extremity   . Hypertension   . Diabetes mellitus without complication   . OSA (obstructive sleep apnea)   . Asthma   . Arthritis   . Morbid obesity    Family History  Problem Relation Age of Onset  . Heart disease Mother   . Diabetes Mother   . Heart disease Father    Past Surgical History  Procedure Laterality Date  . Circumcision      and scrotal surgery  . Cholecystectomy    . Tonsillectomy    . Carpal tunnel release Left   . Knee arthroscopy Bilateral   . Hernia repair    . Gastric bypass    . Spine surgery    . Joint replacement      Short Social History:  History  Substance Use Topics  . Smoking status: Former Smoker    Quit date: 12/14/1994  . Smokeless tobacco: Not on file  . Alcohol Use: 0.0 oz/week    0 Standard drinks or equivalent per week     Comment: rare use    No Known Allergies  Current Outpatient Prescriptions  Medication Sig Dispense Refill  . alum & mag hydroxide-simeth (MAALOX/MYLANTA) 200-200-20 MG/5ML suspension Take by  mouth every 6 (six) hours as needed for indigestion or heartburn.    Marland Kitchen. aspirin 81 MG tablet Take 81 mg by mouth daily.    . calcium carbonate (TUMS - DOSED IN MG ELEMENTAL CALCIUM) 500 MG chewable tablet Chew 1 tablet by mouth daily.    . celecoxib (CELEBREX) 200 MG capsule Take 200 mg by mouth 2 (two) times daily.    . cyclobenzaprine (FLEXERIL) 10 MG tablet Take 10 mg by mouth 3 (three) times daily as needed for muscle spasms.    . diazepam (VALIUM) 5 MG tablet Take 5 mg by mouth every 8 (eight) hours as needed for anxiety.    . diphenoxylate-atropine (LOMOTIL) 2.5-0.025 MG per tablet Take by mouth 4 (four) times daily as needed for diarrhea or loose stools.    . DULoxetine (CYMBALTA) 60 MG capsule Take 60 mg by mouth daily.    . Ferrous Sulfate (IRON) 28 MG TABS Take by mouth.    . furosemide (LASIX) 40 MG tablet Take 40 mg by mouth.    . Hyoscyamine Sulfate (HYOSCYAMINE PO) Take by mouth.    . Insulin Detemir (LEVEMIR) 100 UNIT/ML Pen Inject into the skin daily at 10 pm.    . Multiple Vitamins-Minerals (MULTIVITAMIN & MINERAL PO) Take by mouth.    .Marland Kitchen  omeprazole (PRILOSEC) 40 MG capsule Take 40 mg by mouth daily.    . OxyCODONE (OXYCONTIN) 20 mg T12A 12 hr tablet Take 20 mg by mouth every 12 (twelve) hours.    Marland Kitchen rOPINIRole (REQUIP) 4 MG tablet Take 4 mg by mouth at bedtime.    . simvastatin (ZOCOR) 20 MG tablet Take 20 mg by mouth daily.    Marland Kitchen testosterone cypionate (DEPOTESTOTERONE CYPIONATE) 100 MG/ML injection Inject 100 mg into the muscle every 28 (twenty-eight) days. For IM use only    . vitamin B-12 (CYANOCOBALAMIN) 250 MCG tablet Take 250 mcg by mouth daily.    Marland Kitchen alprazolam (XANAX) 2 MG tablet Take 2 mg by mouth at bedtime as needed for sleep.    Marland Kitchen Oxycodone HCl 10 MG TABS Take 10 mg by mouth every 6 (six) hours.     No current facility-administered medications for this visit.    Review of Systems  Constitutional: Negative for chills and fever.  Eyes: Negative for loss of vision.    Respiratory: Negative for cough and wheezing.  Cardiovascular: Positive for chest pain, chest tightness, claudication, dyspnea with exertion and leg swelling. Negative for orthopnea and palpitations.  GI: Negative for blood in stool and vomiting.  GU: Negative for dysuria and hematuria.  Musculoskeletal: Negative for leg pain, joint pain and myalgias.  Skin: Negative for rash and wound.  Neurological: Positive for numbness. Negative for dizziness and speech difficulty.  Hematologic: Negative for bruises/bleeds easily. Psychiatric: Negative for depressed mood.        Objective:  Objective  Filed Vitals:   01/23/15 0943  BP: 138/63  Pulse: 95  Weight: 224 lb (101.606 kg)  SpO2: 94%   There is no height on file to calculate BMI.  Physical Exam  Constitutional: He is oriented to person, place, and time. He appears well-developed and well-nourished.  HENT:  Head: Normocephalic and atraumatic.  Neck: Neck supple. No JVD present. No thyromegaly present.  Cardiovascular: Normal rate, regular rhythm and normal heart sounds.  Exam reveals no friction rub.   No murmur heard. Pulses:      Femoral pulses are 2+ on the right side, and 2+ on the left side.      Dorsalis pedis pulses are 0 on the right side, and 0 on the left side.       Posterior tibial pulses are 0 on the right side, and 0 on the left side.  I do not detect carotid bruits.  Pulmonary/Chest: Breath sounds normal. He has no wheezes. He has no rales.  Abdominal: Soft. Bowel sounds are normal. There is no tenderness.  I do not palpate an aneurysm, although this is difficult to assess because of his size.  Musculoskeletal: Normal range of motion. He exhibits no edema.  Lymphadenopathy:    He has no cervical adenopathy.  Neurological: He is alert and oriented to person, place, and time. He has normal strength. No sensory deficit.  Skin: No lesion and no rash noted.  Psychiatric: He has a normal mood and affect.     Data: I have independently interpreted his arterial Doppler study. He has triphasic Doppler signals in the right dorsalis pedis and posterior tibial positions with an ABI of greater than 100%. Toe pressure on the right is 61 mmHg. On the left side he has triphasic Doppler signals in the dorsalis pedis and posterior tibial positions. ABIs greater than 100% on the left. Toe pressure on the left is 65 mmHg.  Assessment/Plan:     PERIPHERAL VASCULAR DISEASE: Based on the Doppler study he does not have significant infrainguinal arterial occlusive disease. He has triphasic Doppler signals in both feet. His symptoms are likely more related to his neuropathy although he does have some calf claudication and may have some mild infrainguinal arterial occlusive disease. Fortunately he is not a smoker. I have encouraged him to stay as active as possible. I'll be happy to see him back at any time if she develops new symptoms or his symptoms progress.     Chuck Hint MD [facility]

## 2017-05-12 ENCOUNTER — Other Ambulatory Visit (HOSPITAL_COMMUNITY): Payer: Self-pay | Admitting: Surgery

## 2017-05-12 DIAGNOSIS — Z9884 Bariatric surgery status: Secondary | ICD-10-CM

## 2017-06-07 ENCOUNTER — Ambulatory Visit (HOSPITAL_COMMUNITY)
Admission: RE | Admit: 2017-06-07 | Discharge: 2017-06-07 | Disposition: A | Discharge status: Home / Self Care | Payer: Medicare Other | Source: Ambulatory Visit | Attending: Surgery | Admitting: Surgery

## 2017-06-07 DIAGNOSIS — Z9884 Bariatric surgery status: Secondary | ICD-10-CM | POA: Diagnosis present

## 2017-10-04 NOTE — Progress Notes (Signed)
Please place orders in EPIC as patient is being scheduled for a pre-op appointment! Thank you! 

## 2017-10-05 ENCOUNTER — Ambulatory Visit: Payer: Self-pay | Admitting: Orthopedic Surgery

## 2017-10-10 ENCOUNTER — Ambulatory Visit: Payer: Self-pay | Admitting: Orthopedic Surgery

## 2017-10-13 ENCOUNTER — Other Ambulatory Visit (HOSPITAL_COMMUNITY): Payer: Self-pay | Admitting: Emergency Medicine

## 2017-10-13 NOTE — Patient Instructions (Signed)
Jon Gonzalez  10/13/2017   Your procedure is scheduled on: 10-20-17  [facility]    Report to admitting at 745AM   Call this number if you have problems the morning of surgery  [phone]   Remember: ONLY 1 PERSON MAY GO WITH YOU TO SHORT STAY TO GET  READY MORNING OF YOUR SURGERY.  Do not eat food or drink liquids :After Midnight.     Take these medicines the morning of surgery with A SIP OF WATER: VALIUM IF NEEDED, DULOXETINE(CYMBALTA), OMEPRAZOLE(PRILOSEC), OXYCODONE IF NEEDED, SIMVASTATIN(ZOCOR)                                 You may not have any metal on your body including hair pins and              piercings  Do not wear jewelry, make-up, lotions, powders or perfumes, deodorant                         Men may shave face and neck.   Do not bring valuables to the hospital. [name] IS NOT             RESPONSIBLE   FOR VALUABLES.  Contacts, dentures or bridgework may not be worn into surgery.  Leave suitcase in the car. After surgery it may be brought to your room.                  Please read over the following fact sheets you were given: _____________________________________________________________________  How to Manage Your Diabetes Before and After Surgery  Why is it important to control my blood sugar before and after surgery? . Improving blood sugar levels before and after surgery helps healing and can limit problems. . A way of improving blood sugar control is eating a healthy diet by: o  Eating less sugar and carbohydrates o  Increasing activity/exercise o  Talking with your doctor about reaching your blood sugar goals . High blood sugars (greater than 180 mg/dL) can raise your risk of infections and slow your recovery, so you will need to focus on controlling your diabetes during the weeks before surgery. . Make sure that the doctor who takes care of your diabetes knows about your planned surgery  including the date and location.  How do I manage my blood sugar before surgery? . Check your blood sugar at least 4 times a day, starting 2 days before surgery, to make sure that the level is not too high or low. o Check your blood sugar the morning of your surgery when you wake up and every 2 hours until you get to the Short Stay unit. . If your blood sugar is less than 70 mg/dL, you will need to treat for low blood sugar: o Do not take insulin. o Treat a low blood sugar (less than 70 mg/dL) with  cup of clear juice (cranberry or apple), 4 glucose tablets, OR glucose gel. o Recheck blood sugar in 15 minutes after treatment (to make sure it is greater than 70 mg/dL). If your blood sugar is not greater than 70 mg/dL on recheck, call [phone] for further instructions. . Report your blood sugar to the short stay nurse when you get to Short Stay.  . If you  are admitted to the hospital after surgery: o Your blood sugar will be checked by the staff and you will probably be given insulin after surgery (instead of oral diabetes medicines) to make sure you have good blood sugar levels. o The goal for blood sugar control after surgery is 80-180 mg/dL.   WHAT DO I DO ABOUT MY DIABETES MEDICATION?   . THE NIGHT BEFORE SURGERY, take  1/2 (HALF)  YOUR NORMAL DOSE OF LEVEMIR INSULIN      Patient Signature:  Date:   Nurse Signature:  Date:   Reviewed and Endorsed by [facility] Patient Education Committee, August 2015           [facility] - Preparing for Surgery Before surgery, you can play an important role.  Because skin is not sterile, your skin needs to be as free of germs as possible.  You can reduce the number of germs on your skin by washing with CHG (chlorahexidine gluconate) soap before surgery.  CHG is an antiseptic cleaner which kills germs and bonds with the skin to continue killing germs even after washing. Please DO NOT use if you have an allergy to CHG or antibacterial soaps.   If your skin becomes reddened/irritated stop using the CHG and inform your nurse when you arrive at Short Stay. Do not shave (including legs and underarms) for at least 48 hours prior to the first CHG shower.  You may shave your face/neck. Please follow these instructions carefully:  1.  Shower with CHG Soap the night before surgery and the  morning of Surgery.  2.  If you choose to wash your hair, wash your hair first as usual with your  normal  shampoo.  3.  After you shampoo, rinse your hair and body thoroughly to remove the  shampoo.                           4.  Use CHG as you would any other liquid soap.  You can apply chg directly  to the skin and wash                       Gently with a scrungie or clean washcloth.  5.  Apply the CHG Soap to your body ONLY FROM THE NECK DOWN.   Do not use on face/ open                           Wound or open sores. Avoid contact with eyes, ears mouth and genitals (private parts).                       Wash face,  Genitals (private parts) with your normal soap.             6.  Wash thoroughly, paying special attention to the area where your surgery  will be performed.  7.  Thoroughly rinse your body with warm water from the neck down.  8.  DO NOT shower/wash with your normal soap after using and rinsing off  the CHG Soap.                9.  Pat yourself dry with a clean towel.            10.  Wear clean pajamas.            11.  Place clean  sheets on your bed the night of your first shower and do not  sleep with pets. Day of Surgery : Do not apply any lotions/deodorants the morning of surgery.  Please wear clean clothes to the hospital/surgery center.  FAILURE TO FOLLOW THESE INSTRUCTIONS MAY RESULT IN THE CANCELLATION OF YOUR SURGERY PATIENT SIGNATURE_________________________________  NURSE SIGNATURE__________________________________  ________________________________________________________________________   [name]  An incentive  spirometer is a tool that can help keep your lungs clear and active. This tool measures how well you are filling your lungs with each breath. Taking long deep breaths may help reverse or decrease the chance of developing breathing (pulmonary) problems (especially infection) following:  A long period of time when you are unable to move or be active. BEFORE THE PROCEDURE   If the spirometer includes an indicator to show your best effort, your nurse or respiratory therapist will set it to a desired goal.  If possible, sit up straight or lean slightly forward. Try not to slouch.  Hold the incentive spirometer in an upright position. INSTRUCTIONS FOR USE  1. Sit on the edge of your bed if possible, or sit up as far as you can in bed or on a chair. 2. Hold the incentive spirometer in an upright position. 3. Breathe out normally. 4. Place the mouthpiece in your mouth and seal your lips tightly around it. 5. Breathe in slowly and as deeply as possible, raising the piston or the ball toward the top of the column. 6. Hold your breath for 3-5 seconds or for as long as possible. Allow the piston or ball to fall to the bottom of the column. 7. Remove the mouthpiece from your mouth and breathe out normally. 8. Rest for a few seconds and repeat Steps 1 through 7 at least 10 times every 1-2 hours when you are awake. Take your time and take a few normal breaths between deep breaths. 9. The spirometer may include an indicator to show your best effort. Use the indicator as a goal to work toward during each repetition. 10. After each set of 10 deep breaths, practice coughing to be sure your lungs are clear. If you have an incision (the cut made at the time of surgery), support your incision when coughing by placing a pillow or rolled up towels firmly against it. Once you are able to get out of bed, walk around indoors and cough well. You may stop using the incentive spirometer when instructed by your caregiver.   RISKS AND COMPLICATIONS  Take your time so you do not get dizzy or light-headed.  If you are in pain, you may need to take or ask for pain medication before doing incentive spirometry. It is harder to take a deep breath if you are having pain. AFTER USE  Rest and breathe slowly and easily.  It can be helpful to keep track of a log of your progress. Your caregiver can provide you with a simple table to help with this. If you are using the spirometer at home, follow these instructions: SEEK MEDICAL CARE IF:   You are having difficultly using the spirometer.  You have trouble using the spirometer as often as instructed.  Your pain medication is not giving enough relief while using the spirometer.  You develop fever of 100.5 F (38.1 C) or higher. SEEK IMMEDIATE MEDICAL CARE IF:   You cough up bloody sputum that had not been present before.  You develop fever of 102 F (38.9 C) or greater.  You  develop worsening pain at or near the incision site. MAKE SURE YOU:   Understand these instructions.  Will watch your condition.  Will get help right away if you are not doing well or get worse. Document Released: 04/12/2007 Document Revised: 02/22/2012 Document Reviewed: 06/13/2007 Exit[employer] Patient Information 2014 ExitCare, [place].   ________________________________________________________________________  WHAT IS A BLOOD TRANSFUSION? Blood Transfusion Information  A transfusion is the replacement of blood or some of its parts. Blood is made up of multiple cells which provide different functions.  Red blood cells carry oxygen and are used for blood loss replacement.  White blood cells fight against infection.  Platelets control bleeding.  Plasma helps clot blood.  Other blood products are available for specialized needs, such as hemophilia or other clotting disorders. BEFORE THE TRANSFUSION  Who gives blood for transfusions?   Healthy volunteers who are fully evaluated  to make sure their blood is safe. This is blood bank blood. Transfusion therapy is the safest it has ever been in the practice of medicine. Before blood is taken from a donor, a complete history is taken to make sure that person has no history of diseases nor engages in risky social behavior (examples are intravenous drug use or sexual activity with multiple partners). The donor's travel history is screened to minimize risk of transmitting infections, such as malaria. The donated blood is tested for signs of infectious diseases, such as HIV and hepatitis. The blood is then tested to be sure it is compatible with you in order to minimize the chance of a transfusion reaction. If you or a relative donates blood, this is often done in anticipation of surgery and is not appropriate for emergency situations. It takes many days to process the donated blood. RISKS AND COMPLICATIONS Although transfusion therapy is very safe and saves many lives, the main dangers of transfusion include:   Getting an infectious disease.  Developing a transfusion reaction. This is an allergic reaction to something in the blood you were given. Every precaution is taken to prevent this. The decision to have a blood transfusion has been considered carefully by your caregiver before blood is given. Blood is not given unless the benefits outweigh the risks. AFTER THE TRANSFUSION  Right after receiving a blood transfusion, you will usually feel much better and more energetic. This is especially true if your red blood cells have gotten low (anemic). The transfusion raises the level of the red blood cells which carry oxygen, and this usually causes an energy increase.  The nurse administering the transfusion will monitor you carefully for complications. HOME CARE INSTRUCTIONS  No special instructions are needed after a transfusion. You may find your energy is better. Speak with your caregiver about any limitations on activity for  underlying diseases you may have. SEEK MEDICAL CARE IF:   Your condition is not improving after your transfusion.  You develop redness or irritation at the intravenous (IV) site. SEEK IMMEDIATE MEDICAL CARE IF:  Any of the following symptoms occur over the next 12 hours:  Shaking chills.  You have a temperature by mouth above 102 F (38.9 C), not controlled by medicine.  Chest, back, or muscle pain.  People around you feel you are not acting correctly or are confused.  Shortness of breath or difficulty breathing.  Dizziness and fainting.  You get a rash or develop hives.  You have a decrease in urine output.  Your urine turns a dark color or changes to pink, red, or brown. Any of  the following symptoms occur over the next 10 days:  You have a temperature by mouth above 102 F (38.9 C), not controlled by medicine.  Shortness of breath.  Weakness after normal activity.  The white part of the eye turns yellow (jaundice).  You have a decrease in the amount of urine or are urinating less often.  Your urine turns a dark color or changes to pink, red, or brown. Document Released: 11/27/2000 Document Revised: 02/22/2012 Document Reviewed: 07/16/2008 [facility] Patient Information 2014 [place], [place].  _______________________________________________________________________

## 2017-10-14 ENCOUNTER — Encounter (HOSPITAL_COMMUNITY)
Admission: RE | Admit: 2017-10-14 | Discharge: 2017-10-14 | Disposition: A | Discharge status: Home / Self Care | Payer: Medicare Other | Source: Ambulatory Visit | Attending: Orthopedic Surgery | Admitting: Orthopedic Surgery

## 2017-10-20 ENCOUNTER — Encounter (HOSPITAL_COMMUNITY): Admission: RE | Payer: Self-pay | Source: Ambulatory Visit

## 2017-10-20 ENCOUNTER — Inpatient Hospital Stay (HOSPITAL_COMMUNITY): Admission: RE | Admit: 2017-10-20 | Payer: Medicare Other | Source: Ambulatory Visit | Admitting: Orthopedic Surgery

## 2017-10-20 SURGERY — TOTAL KNEE ARTHROPLASTY WITH REVISION COMPONENTS
Anesthesia: Choice | Site: Knee | Laterality: Right
